# Patient Record
Sex: Female | Born: 1935 | Race: White | Hispanic: No | Marital: Married | Smoking: Former smoker
Health system: Southern US, Community
[De-identification: ages and names within clinical notes are randomized; demographics above are authoritative.]

## PROBLEM LIST (undated history)

## (undated) DIAGNOSIS — G4730 Sleep apnea, unspecified: Secondary | ICD-10-CM

## (undated) DIAGNOSIS — I10 Essential (primary) hypertension: Secondary | ICD-10-CM

## (undated) DIAGNOSIS — E785 Hyperlipidemia, unspecified: Secondary | ICD-10-CM

## (undated) HISTORY — DX: Essential (primary) hypertension: I10

## (undated) HISTORY — DX: Hyperlipidemia, unspecified: E78.5

## (undated) HISTORY — PX: VESICOVAGINAL FISTULA CLOSURE W/ TAH: SUR271

## (undated) HISTORY — DX: Sleep apnea, unspecified: G47.30

---

## 1982-06-19 HISTORY — PX: GALLBLADDER SURGERY: SHX652

## 1998-10-25 ENCOUNTER — Other Ambulatory Visit: Admission: RE | Admit: 1998-10-25 | Discharge: 1998-10-25 | Payer: Self-pay | Admitting: Obstetrics and Gynecology

## 2000-05-24 ENCOUNTER — Other Ambulatory Visit: Admission: RE | Admit: 2000-05-24 | Discharge: 2000-05-24 | Payer: Self-pay | Admitting: *Deleted

## 2000-08-25 ENCOUNTER — Emergency Department (HOSPITAL_COMMUNITY): Admission: EM | Admit: 2000-08-25 | Discharge: 2000-08-25 | Payer: Self-pay | Admitting: Emergency Medicine

## 2000-11-22 ENCOUNTER — Encounter: Payer: Self-pay | Admitting: Family Medicine

## 2000-11-22 ENCOUNTER — Encounter: Admission: RE | Admit: 2000-11-22 | Discharge: 2000-11-22 | Payer: Self-pay | Admitting: Family Medicine

## 2001-04-29 ENCOUNTER — Encounter: Admission: RE | Admit: 2001-04-29 | Discharge: 2001-04-29 | Payer: Self-pay | Admitting: Family Medicine

## 2001-04-29 ENCOUNTER — Encounter: Payer: Self-pay | Admitting: Family Medicine

## 2001-08-09 ENCOUNTER — Other Ambulatory Visit: Admission: RE | Admit: 2001-08-09 | Discharge: 2001-08-09 | Payer: Self-pay | Admitting: Obstetrics and Gynecology

## 2005-06-19 HISTORY — PX: OTHER SURGICAL HISTORY: SHX169

## 2008-04-06 ENCOUNTER — Encounter: Payer: Self-pay | Admitting: *Deleted

## 2008-04-06 DIAGNOSIS — E785 Hyperlipidemia, unspecified: Secondary | ICD-10-CM | POA: Insufficient documentation

## 2008-04-06 DIAGNOSIS — F329 Major depressive disorder, single episode, unspecified: Secondary | ICD-10-CM | POA: Insufficient documentation

## 2008-04-06 DIAGNOSIS — K219 Gastro-esophageal reflux disease without esophagitis: Secondary | ICD-10-CM | POA: Insufficient documentation

## 2008-04-06 DIAGNOSIS — I10 Essential (primary) hypertension: Secondary | ICD-10-CM | POA: Insufficient documentation

## 2008-04-06 DIAGNOSIS — R32 Unspecified urinary incontinence: Secondary | ICD-10-CM | POA: Insufficient documentation

## 2008-04-07 ENCOUNTER — Ambulatory Visit: Payer: Self-pay | Admitting: *Deleted

## 2008-04-07 DIAGNOSIS — K449 Diaphragmatic hernia without obstruction or gangrene: Secondary | ICD-10-CM | POA: Insufficient documentation

## 2008-04-07 DIAGNOSIS — M159 Polyosteoarthritis, unspecified: Secondary | ICD-10-CM | POA: Insufficient documentation

## 2008-04-07 DIAGNOSIS — G4733 Obstructive sleep apnea (adult) (pediatric): Secondary | ICD-10-CM | POA: Insufficient documentation

## 2008-04-10 ENCOUNTER — Encounter (INDEPENDENT_AMBULATORY_CARE_PROVIDER_SITE_OTHER): Payer: Self-pay | Admitting: *Deleted

## 2008-04-16 DIAGNOSIS — I2510 Atherosclerotic heart disease of native coronary artery without angina pectoris: Secondary | ICD-10-CM | POA: Insufficient documentation

## 2008-04-16 DIAGNOSIS — R002 Palpitations: Secondary | ICD-10-CM | POA: Insufficient documentation

## 2008-04-16 DIAGNOSIS — D649 Anemia, unspecified: Secondary | ICD-10-CM | POA: Insufficient documentation

## 2008-04-16 DIAGNOSIS — E739 Lactose intolerance, unspecified: Secondary | ICD-10-CM | POA: Insufficient documentation

## 2008-04-16 DIAGNOSIS — Z8669 Personal history of other diseases of the nervous system and sense organs: Secondary | ICD-10-CM | POA: Insufficient documentation

## 2008-04-16 DIAGNOSIS — R42 Dizziness and giddiness: Secondary | ICD-10-CM | POA: Insufficient documentation

## 2008-04-16 DIAGNOSIS — J45909 Unspecified asthma, uncomplicated: Secondary | ICD-10-CM | POA: Insufficient documentation

## 2008-04-16 DIAGNOSIS — R209 Unspecified disturbances of skin sensation: Secondary | ICD-10-CM | POA: Insufficient documentation

## 2008-04-16 DIAGNOSIS — F411 Generalized anxiety disorder: Secondary | ICD-10-CM | POA: Insufficient documentation

## 2011-06-22 ENCOUNTER — Telehealth: Payer: Self-pay | Admitting: Internal Medicine

## 2011-06-22 NOTE — Telephone Encounter (Signed)
Error.  Ok to schedule.  Christine Mcconnell

## 2011-07-14 ENCOUNTER — Ambulatory Visit (INDEPENDENT_AMBULATORY_CARE_PROVIDER_SITE_OTHER): Payer: Medicare Other | Admitting: Internal Medicine

## 2011-07-14 ENCOUNTER — Institutional Professional Consult (permissible substitution): Payer: Self-pay | Admitting: Internal Medicine

## 2011-07-14 ENCOUNTER — Encounter: Payer: Self-pay | Admitting: Internal Medicine

## 2011-07-14 DIAGNOSIS — J449 Chronic obstructive pulmonary disease, unspecified: Secondary | ICD-10-CM

## 2011-07-14 DIAGNOSIS — J45909 Unspecified asthma, uncomplicated: Secondary | ICD-10-CM

## 2011-07-14 NOTE — Progress Notes (Signed)
07/14/11- 30 yoF former smoker referred courtesy of Primus Bravo, NP, [facility] because of wheezing dyspnea.  She quit smoking 25 years ago. She describes wheezing over the past several months, not prior. There is no history of asthma but she has had previous bronchitis and pneumonia. It helps to use her albuterol inhaler. She was put on albuterol 2 mg tablets used 3 times daily and well tolerated. He wheezes at night and randomly through the day with a little variation from one day to the next. She doesn't recognize the effect of location, whether or exposure. She does notice shortness of breath with exertion. She had had a recent chest x-ray not yet available to [place]. Admits heartburn, controlled by Prilosec. She refluxes solid foods and coughs drinking liquids. Medical history of coronary disease with 2 stents, hypertension but no infarction or heart failure. Surgery for bilateral knee replacement without history of DVT. Treated for sleep apnea. Diagnosed with a mild CVA in 1985. Has had flu and pneumococcal vaccines. Married to [place]- a patient here, retired from housekeeping at [employer]. 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  ROS-see HPI Constitutional:   No-   weight loss, night sweats, fevers, chills, fatigue, lassitude. HEENT:   No-  headaches, difficulty swallowing, tooth/dental problems, sore throat,       No-  sneezing, itching, ear ache, nasal congestion, post nasal drip,  CV:  No-   chest pain, orthopnea, PND, swelling in lower extremities, anasarca, dizziness, palpitations Resp: No-   shortness of breath with exertion or at rest.              No-   productive cough,  No non-productive cough,  No- coughing up of blood.              No-   change in color of mucus.  No- wheezing.   Skin: No-   rash or lesions. GI:  + heartburn, indigestion,  No-abdominal pain, nausea, vomiting, diarrhea,                 change in bowel habits, loss of appetite GU: No-   dysuria, change in color of urine, no  urgency or frequency.  No- flank pain. MS:  + joint pain or swelling.  No- decreased range of motion.  No- back pain. Neuro-     nothing unusual Psych:  No- change in mood or affect. No depression, + anxiety.  No memory loss.   OBJ General- Alert, Oriented, Affect-appropriate, Distress- none acute, obese Skin- rash-none, lesions- none, excoriation- none Lymphadenopathy- none Head- atraumatic            Eyes- Gross vision intact, PERRLA, conjunctivae clear secretions            Ears- Hearing, canals-normal            Nose- Ccrusting mucus, no-Septal dev, mucus, polyps, erosion, perforation             Throat- Mallampati II , mucosa clear , drainage- none, tonsils- atrophic, dentures Neck- flexible , trachea midline, no stridor , thyroid nl, carotid no bruit Chest - symmetrical excursion , unlabored           Heart/CV- RRR , no murmur , no gallop  , no rub, nl s1 s2                           - JVD- none , edema- none, stasis changes- none, varices- none  Lung- clear to P&A, shallow, wheeze- none, cough- none , dullness-none, rub- none           Chest wall-  Abd- tender-no, distended-no, bowel sounds-present, HSM- no Br/ Gen/ Rectal- Not done, not indicated Extrem- cyanosis- none, clubbing, none, atrophy- none, strength- nl Neuro- resting tremor R hand

## 2011-07-14 NOTE — Patient Instructions (Signed)
We will contact your primary physician for report of your last CXR and the cardiac stress test for our records.  Sample Tudorza- 1 puff twice daily. You can use this in addition to, or instead of, your albuterol tablets.  Order- schedule PFT  Dx  asthma

## 2011-07-16 NOTE — Assessment & Plan Note (Addendum)
We need to get baseline pulmonary function tests to understand how much reversible lung disease she has, and how much of what she describes is related to obstructive airways disease as opposed to obesity hypoventilation, deconditioning and may be her heart disease. She has had a stress test at some time in the past. Albuterol tablets are not a common early medication choices and a longer but she is tolerating them. Plan-schedule PFT, sample trial Tudorza.

## 2011-08-11 ENCOUNTER — Ambulatory Visit (INDEPENDENT_AMBULATORY_CARE_PROVIDER_SITE_OTHER): Payer: Medicare Other | Admitting: Internal Medicine

## 2011-08-11 ENCOUNTER — Encounter: Payer: Self-pay | Admitting: Internal Medicine

## 2011-08-11 VITALS — BP 142/90 | HR 75 | Ht 62.0 in | Wt 234.0 lb

## 2011-08-11 DIAGNOSIS — J45909 Unspecified asthma, uncomplicated: Secondary | ICD-10-CM

## 2011-08-11 LAB — PULMONARY FUNCTION TEST

## 2011-08-11 NOTE — Patient Instructions (Signed)
Stop the Tudorza inhaler, since we can't tell that it its helping you.   Sample Dulera 100     2 puffs, then rinse mouth, twice every day   Walk for stamina and weight loss, as much as you can.

## 2011-08-11 NOTE — Progress Notes (Signed)
07/14/11- 87 yoF former smoker referred courtesy of Primus Bravo, NP, [facility] because of wheezing dyspnea.  She quit smoking 25 years ago. She describes wheezing over the past several months, not prior. There is no history of asthma but she has had previous bronchitis and pneumonia. It helps to use her albuterol inhaler. She was put on albuterol 2 mg tablets used 3 times daily and well tolerated. He wheezes at night and randomly through the day with a little variation from one day to the next. She doesn't recognize the effect of location, whether or exposure. She does notice shortness of breath with exertion. She had had a recent chest x-ray not yet available to [place]. Admits heartburn, controlled by Prilosec. She refluxes solid foods and coughs drinking liquids. Medical history of coronary disease with 2 stents, hypertension but no infarction or heart failure. Surgery for bilateral knee replacement without history of DVT. Treated for sleep apnea. Diagnosed with a mild CVA in 1985. Has had flu and pneumococcal vaccines. Married to [place]- a patient here, retired from housekeeping at [employer]. 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  08/11/11- 26 yoF former smoker referred courtesy of Primus Bravo, NP, [facility] because of wheezing dyspnea.   Husband here. She still likes her albuterol tablets which she says causes no nervousness or palpitation. Saw a little added benefit from [place] trial. PFT- 08/11/2011 slight obstructive airways disease in small airways with response to bronchodilator. FEV1 1.41/86%, FEV1/FVC 0.81 TLC 88% DLCO 74%.  ROS-see HPI Constitutional:   No-   weight loss, night sweats, fevers, chills, fatigue, lassitude. HEENT:   No-  headaches, difficulty swallowing, tooth/dental problems, sore throat,       No-  sneezing, itching, ear ache, nasal congestion, post nasal drip,  CV:  No-   chest pain, orthopnea, PND, swelling in lower extremities, anasarca, dizziness, palpitations Resp:  + shortness of breath with exertion or at rest.              No-   productive cough,  No non-productive cough,  No- coughing up of blood.              No-   change in color of mucus.  + wheezing.   Skin: No-   rash or lesions. GI:  + heartburn, indigestion,  No-abdominal pain, nausea, vomiting, diarrhea,                 change in bowel habits, loss of appetite GU:  MS:  + joint pain or swelling.  No- decreased range of motion.  No- back pain. Neuro-     nothing unusual Psych:  No- change in mood or affect. No depression, + anxiety.  No memory loss.   OBJ General- Alert, Oriented, Affect-appropriate, Distress- none acute, obese Skin- rash-none, lesions- none, excoriation- none Lymphadenopathy- none Head- atraumatic            Eyes- Gross vision intact, PERRLA, conjunctivae clear secretions            Ears- Hearing, canals-normal            Nose- Ccrusting mucus, no-Septal dev, mucus, polyps, erosion, perforation             Throat- Mallampati II , mucosa clear , drainage- none, tonsils- atrophic, dentures Neck- flexible , trachea midline, no stridor , thyroid nl, carotid no bruit Chest - symmetrical excursion , unlabored           Heart/CV- RRR , no murmur , no gallop  ,  no rub, nl s1 s2                           - JVD- none , edema- none, stasis changes- none, varices- none           Lung- trace wheeze, shallow, , cough- none , dullness-none, rub- none           Chest wall-  Abd-  Br/ Gen/ Rectal- Not done, not indicated Extrem- cyanosis- none, clubbing, none, atrophy- none, strength- nl Neuro- resting tremor R hand

## 2011-08-11 NOTE — Progress Notes (Signed)
PFT done today. 

## 2011-08-14 ENCOUNTER — Encounter: Payer: Self-pay | Admitting: Internal Medicine

## 2011-08-14 NOTE — Assessment & Plan Note (Signed)
Mild reversible small airways obstructive disease consistent with asthma rather than fixed COPD. I explained why albuterol tablets are not preferred, because of the increased incidence of systemic side effects. For comparison she will try a maintenance steroid/bronchodilator, saving the albuterol tablets for rescue use. Weight loss and walking to improve stamina are recommended.

## 2011-09-25 ENCOUNTER — Telehealth: Payer: Self-pay | Admitting: Internal Medicine

## 2011-09-25 NOTE — Telephone Encounter (Signed)
Left message that we have the papers from [facility] about new CPAP machine; I will have CY sign papers in am and will fax back asap. If patient should have any other questions or concerns then she should call our office.

## 2011-11-14 ENCOUNTER — Other Ambulatory Visit: Payer: Self-pay | Admitting: Sports Medicine

## 2011-11-14 ENCOUNTER — Ambulatory Visit: Payer: Medicare Other | Admitting: Internal Medicine

## 2011-11-14 DIAGNOSIS — M47817 Spondylosis without myelopathy or radiculopathy, lumbosacral region: Secondary | ICD-10-CM

## 2011-11-21 ENCOUNTER — Other Ambulatory Visit: Payer: Self-pay | Admitting: Sports Medicine

## 2011-11-21 ENCOUNTER — Ambulatory Visit
Admission: RE | Admit: 2011-11-21 | Discharge: 2011-11-21 | Disposition: A | Discharge status: Home / Self Care | Payer: Medicare Other | Source: Ambulatory Visit | Attending: Sports Medicine | Admitting: Sports Medicine

## 2011-11-21 DIAGNOSIS — M47817 Spondylosis without myelopathy or radiculopathy, lumbosacral region: Secondary | ICD-10-CM

## 2012-03-15 ENCOUNTER — Encounter: Payer: Self-pay | Admitting: Internal Medicine

## 2012-03-15 ENCOUNTER — Ambulatory Visit (INDEPENDENT_AMBULATORY_CARE_PROVIDER_SITE_OTHER)
Admission: RE | Admit: 2012-03-15 | Discharge: 2012-03-15 | Disposition: A | Discharge status: Home / Self Care | Payer: Medicare Other | Source: Ambulatory Visit | Attending: Internal Medicine | Admitting: Internal Medicine

## 2012-03-15 ENCOUNTER — Ambulatory Visit (INDEPENDENT_AMBULATORY_CARE_PROVIDER_SITE_OTHER): Payer: Medicare Other | Admitting: Internal Medicine

## 2012-03-15 VITALS — BP 136/86 | HR 63 | Ht 62.0 in | Wt 237.8 lb

## 2012-03-15 DIAGNOSIS — J45909 Unspecified asthma, uncomplicated: Secondary | ICD-10-CM

## 2012-03-15 DIAGNOSIS — Z23 Encounter for immunization: Secondary | ICD-10-CM

## 2012-03-15 MED ORDER — ALBUTEROL SULFATE HFA 108 (90 BASE) MCG/ACT IN AERS: 2.0000 | INHALATION_SPRAY | Freq: Four times a day (QID) | RESPIRATORY_TRACT | Status: DC | PRN

## 2012-03-15 NOTE — Patient Instructions (Addendum)
Order- CXR  Dx asthma  Flu vax  Script for Proair rescue albuterol inhaler refill  sent

## 2012-03-15 NOTE — Progress Notes (Signed)
07/14/11- 65 yoF former smoker referred courtesy of Primus Bravo, NP, [facility] because of wheezing dyspnea.  She quit smoking 25 years ago. She describes wheezing over the past several months, not prior. There is no history of asthma but she has had previous bronchitis and pneumonia. It helps to use her albuterol inhaler. She was put on albuterol 2 mg tablets used 3 times daily and well tolerated. He wheezes at night and randomly through the day with a little variation from one day to the next. She doesn't recognize the effect of location, whether or exposure. She does notice shortness of breath with exertion. She had had a recent chest x-ray not yet available to [place]. Admits heartburn, controlled by Prilosec. She refluxes solid foods and coughs drinking liquids. Medical history of coronary disease with 2 stents, hypertension but no infarction or heart failure. Surgery for bilateral knee replacement without history of DVT. Treated for sleep apnea. Diagnosed with a mild CVA in 1985. Has had flu and pneumococcal vaccines. 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  08/11/11- 10 yoF former smoker referred courtesy of Primus Bravo, NP, [facility] because of wheezing dyspnea.   Husband here. She still likes her albuterol tablets which she says causes no nervousness or palpitation. Saw a little added benefit from [place] trial. PFT- 08/11/2011 slight obstructive airways disease in small airways with response to bronchodilator. FEV1 1.41/86%, FEV1/FVC 0.81 TLC 88% DLCO 74%.  03/15/12- 26 yoF former smoker referred courtesy of Primus Bravo, NP, [facility] because of wheezing dyspnea/ Allergic Asthma, complicated by CAD/ stents, OSA/ CPAP. Husband here  Pt states breathing is "fair". Elwin Sleight was too expensive. Spiriva helps some. Still some wheeze. Proair rescue inhaler 2 or 3 times daily plus albuterol tablets 2 mg, 3 times  daily. Uses CPAP/ [facility], unknown pressure, based on sleep study done in [place].  ROS-see HPI Constitutional:   No-   weight loss, night sweats, fevers, chills, fatigue, lassitude. HEENT:   No-  headaches, difficulty swallowing, tooth/dental problems, sore throat,       No-  sneezing, itching, ear ache, nasal congestion, post nasal drip,  CV:  No-   chest pain, orthopnea, PND, swelling in lower extremities, anasarca, dizziness, palpitations Resp: + shortness of breath with exertion or at rest.              No-   productive cough,  No non-productive cough,  No- coughing up of blood.              No-   change in color of mucus.  + wheezing.   Skin: No-   rash or lesions. GI:  + heartburn, indigestion,  No-abdominal pain, nausea, vomiting,            GU:  MS:  + joint pain or swelling.   Neuro-     nothing unusual Psych:  No- change in mood or affect. No depression, + anxiety.  No memory loss.   OBJ General- Alert, Oriented, Affect-appropriate, Distress- none acute, obese Skin- rash-none, lesions- none, excoriation- none Lymphadenopathy- none Head- atraumatic            Eyes- Gross vision intact, PERRLA, conjunctivae clear secretions            Ears- Hearing, canals-normal            Nose- +Crusting mucus, no-Septal dev, mucus, polyps, erosion, perforation  Throat- Mallampati III , mucosa clear , drainage- none, tonsils- atrophic, dentures Neck- flexible , trachea midline, no stridor , thyroid nl, carotid no bruit Chest - symmetrical excursion , unlabored           Heart/CV- RRR , no murmur , no gallop  , no rub, nl s1 s2                           - JVD- none , edema- none, stasis changes- none, varices- none           Lung-  Clear,  cough- none , dullness-none, rub- none           Chest wall-  Abd-  Br/ Gen/ Rectal- Not done, not indicated Extrem- cyanosis- none, clubbing, none, atrophy- none, strength- nl Neuro- resting tremor R hand

## 2012-03-22 NOTE — Progress Notes (Signed)
Quick Note:  Pt aware of results. ______ 

## 2012-03-24 NOTE — Assessment & Plan Note (Signed)
Finances are limited. We considered using a maintenance steroid but decided to see how she does as the season progresses.  Plan- refill rescue inhaler with education on use, flu vaccine,CXR

## 2012-05-02 ENCOUNTER — Telehealth: Payer: Self-pay | Admitting: Internal Medicine

## 2012-05-02 DIAGNOSIS — J45909 Unspecified asthma, uncomplicated: Secondary | ICD-10-CM

## 2012-05-02 NOTE — Telephone Encounter (Signed)
I spoke with pt and she is requesting samples of albuterol sulfate tablets, spiriva, and albuterol inhaler. I advised pt we did not have albuterol sulfate tablets. She is wanting to an alternative to this as this is $250 and she is in the "gap". Please advise thanks

## 2012-05-03 NOTE — Telephone Encounter (Signed)
Cheapest for her would be to get set up next week with a nebulizer machine and albuterol 0.083, # 100, 1 neb every 6 hours as needed. Refill prn  For dx asthma

## 2012-05-06 MED ORDER — ALBUTEROL SULFATE (2.5 MG/3ML) 0.083% IN NEBU: 2.5000 mg | INHALATION_SOLUTION | Freq: Four times a day (QID) | RESPIRATORY_TRACT | Status: AC | PRN

## 2012-05-06 MED ORDER — ALBUTEROL SULFATE (2.5 MG/3ML) 0.083% IN NEBU: 2.5000 mg | INHALATION_SOLUTION | Freq: Four times a day (QID) | RESPIRATORY_TRACT | Status: DC | PRN

## 2012-05-06 NOTE — Telephone Encounter (Signed)
lmtcb x1 

## 2012-05-06 NOTE — Telephone Encounter (Signed)
Spoke with pt and notified of recs per CDY She verbalized understanding and is fine with trying nebs Order was sent to DME and rx faxed to [facility]

## 2012-06-24 ENCOUNTER — Telehealth: Payer: Self-pay | Admitting: Internal Medicine

## 2012-06-24 NOTE — Telephone Encounter (Signed)
I spoke with the pt daughter and she is requesting a follow-up appt this week for the pt. Appt made for tomorrow at 10am. Carron Curie, CMA

## 2012-06-25 ENCOUNTER — Ambulatory Visit (INDEPENDENT_AMBULATORY_CARE_PROVIDER_SITE_OTHER): Payer: Medicare Other | Admitting: Internal Medicine

## 2012-06-25 ENCOUNTER — Encounter: Payer: Self-pay | Admitting: Internal Medicine

## 2012-06-25 VITALS — BP 142/80 | HR 91 | Ht 62.0 in | Wt 241.4 lb

## 2012-06-25 DIAGNOSIS — I890 Lymphedema, not elsewhere classified: Secondary | ICD-10-CM

## 2012-06-25 DIAGNOSIS — G4730 Sleep apnea, unspecified: Secondary | ICD-10-CM

## 2012-06-25 DIAGNOSIS — R918 Other nonspecific abnormal finding of lung field: Secondary | ICD-10-CM

## 2012-06-25 DIAGNOSIS — R911 Solitary pulmonary nodule: Secondary | ICD-10-CM

## 2012-06-25 NOTE — Progress Notes (Signed)
07/14/11- 33 yoF former smoker referred courtesy of Primus Bravo, NP, [facility] because of wheezing dyspnea.  She quit smoking 25 years ago. She describes wheezing over the past several months, not prior. There is no history of asthma but she has had previous bronchitis and pneumonia. It helps to use her albuterol inhaler. She was put on albuterol 2 mg tablets used 3 times daily and well tolerated. He wheezes at night and randomly through the day with a little variation from one day to the next. She doesn't recognize the effect of location, whether or exposure. She does notice shortness of breath with exertion. She had had a recent chest x-ray not yet available to [place]. Admits heartburn, controlled by Prilosec. She refluxes solid foods and coughs drinking liquids. Medical history of coronary disease with 2 stents, hypertension but no infarction or heart failure. Surgery for bilateral knee replacement without history of DVT. Treated for sleep apnea. Diagnosed with a mild CVA in 1985. Has had flu and pneumococcal vaccines. 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  08/11/11- 83 yoF former smoker referred courtesy of Primus Bravo, NP, [facility] because of wheezing dyspnea.   Husband here. She still likes her albuterol tablets which she says causes no nervousness or palpitation. Saw a little added benefit from [place] trial. PFT- 08/11/2011 slight obstructive airways disease in small airways with response to bronchodilator. FEV1 1.41/86%, FEV1/FVC 0.81 TLC 88% DLCO 74%.  03/15/12- 36 yoF former smoker referred courtesy of Primus Bravo, NP, [facility] because of wheezing dyspnea/ Allergic Asthma, complicated by CAD/ stents, OSA/ CPAP. Husband here  Pt states breathing is "fair". Elwin Sleight was too expensive. Spiriva helps some. Still some wheeze. Proair rescue inhaler 2 or 3 times daily plus albuterol tablets 2 mg, 3 times  daily. Uses CPAP/ [facility], unknown pressure, based on sleep study done in [place].  06/25/12- 67 yoF former smoker referred courtesy of Primus Bravo, NP, [facility] because of wheezing dyspnea/ Allergic Asthma, complicated by CAD/ stents, OSA/ CPAP. Husband here Since last here, hospitalized at [facility] December 23 through 06/14/2012 for acute on chronic respiratory failure possible early pneumonia, 1.5 cm nodule. Discharge summary reviewed. She says she went because her legs were red and swollen. She dropped off of CPAP. Breathing is now better, can walk short distances in the home but is unsteady and dyspneic. She is unclear about her medications but has home nurse and physical therapist coming. O2 2L/ [facility] CXR 03/22/12- IMPRESSION:  No active disease. Markedly tortuous ectatic thoracic aorta.  Upper thoracic levoscoliosis and lower thoracic dextroscoliosis.  Original Report Authenticated By: Natasha Mead, M.D.  [facility]hest 06/10/2012- Report available: Clustered nodules left lower lobe, likely infectious, isolated 1.5 cm nodule left lower lobe with followup CT recommended in 2-3 months, limited technically for evaluation of pulmonary embolism with no central emboli seen, coronary disease.  ROS-see HPI Constitutional:   No-   weight loss, night sweats, fevers, chills, fatigue, lassitude. HEENT:   No-  headaches, difficulty swallowing, tooth/dental problems, sore throat,       No-  sneezing, itching, ear ache, nasal congestion, post nasal drip,  CV:  No-   chest pain, orthopnea, PND, +swelling in lower extremities, no-anasarca, dizziness, palpitations Resp: + shortness of breath with exertion or at rest.              No-   productive cough,  No non-productive cough,  No- coughing up of blood.  No-   change in color of mucus.  + wheezing.   Skin: No-   rash or lesions. GI:  + heartburn, indigestion,  No-abdominal pain, nausea, vomiting,             GU:  MS:  + joint pain or swelling.   Neuro-     nothing unusual Psych:  No- change in mood or affect. No depression, + anxiety.  No memory loss.   OBJ BP 142/80  Pulse 91  Ht 5\' 2"  (1.575 m)  Wt 241 lb 6.4 oz (109.498 kg)  BMI 44.15 kg/m2  SpO2 96% O2 2L General- Alert, Oriented, Affect-appropriate, Distress- none acute, obese Skin- rash-none, lesions- none, excoriation- none Lymphadenopathy- none Head- atraumatic            Eyes- Gross vision intact, PERRLA, conjunctivae clear secretions            Ears- Hearing, canals-normal            Nose- +Crusting mucus, no-Septal dev, mucus, polyps, erosion, perforation             Throat- Mallampati III , mucosa clear , drainage- none, tonsils- atrophic, dentures Neck- flexible , trachea midline, no stridor , thyroid nl, carotid no bruit Chest - symmetrical excursion , unlabored           Heart/CV- RRR , no murmur , no gallop  , no rub, nl s1 s2                           - JVD- none , edema+2-3, stasis changes+, varices- none           Lung-  + few crackles,  cough- none , dullness-none, rub- none           Chest wall-  Abd-  Br/ Gen/ Rectal- Not done, not indicated Extrem- cyanosis- none, clubbing, none, atrophy- none, strength-poor, rolling walker Neuro- resting tremor R hand

## 2012-06-25 NOTE — Patient Instructions (Addendum)
Order- future CT chest at [facility] dx lung nodule    Compare with prior            Need this done before her return here at end of March  Order- CME- [facility]  She now has CPAP from [facility] Home O2 from [facility]. They need to come out TODAY and show her how to connect her O2 to her CPAP for night use.

## 2012-07-06 DIAGNOSIS — R918 Other nonspecific abnormal finding of lung field: Secondary | ICD-10-CM | POA: Insufficient documentation

## 2012-07-06 DIAGNOSIS — R911 Solitary pulmonary nodule: Secondary | ICD-10-CM | POA: Insufficient documentation

## 2012-07-06 DIAGNOSIS — I890 Lymphedema, not elsewhere classified: Secondary | ICD-10-CM | POA: Insufficient documentation

## 2012-07-06 NOTE — Assessment & Plan Note (Signed)
Plan-we are requesting the CT disc for our review. Meanwhile three-month followup CT, to be done in March, he is being scheduled for comparison at [facility].

## 2012-07-06 NOTE — Assessment & Plan Note (Signed)
Noncompliant, claiming to her DME company did not show her how to use oxygen with CPAP so she dropped the CPAP. Plan-we're contacting [facility] to show her today how to bleed oxygen at 2 L into her CPAP for sleep at night. We also need to learn for our records what her CPAP pressure setting is.

## 2012-07-06 NOTE — Assessment & Plan Note (Signed)
This may be atypical infection such as MAIC. We will compare when she has a CT scan in March, 2014

## 2012-07-06 NOTE — Assessment & Plan Note (Signed)
Probably accommodation of fluid overload, obesity and venous insufficiency. Exam is nonspecific. Venous thromboembolic disease is not identified during hospitalization. This is managed by her primary physician.

## 2012-07-29 ENCOUNTER — Telehealth: Payer: Self-pay | Admitting: Internal Medicine

## 2012-07-29 NOTE — Telephone Encounter (Signed)
Spoke with patient made her aware of recs as listed below per CY. Verbalized understanding and nothing further needed at this time.

## 2012-07-29 NOTE — Telephone Encounter (Signed)
Use O2 for sleep and with exertion at 2L/M

## 2012-07-29 NOTE — Telephone Encounter (Signed)
Spoke with pt She states that she is needing to know when to use o2 She states that she thought that she was just supposed to use at night with CPAP, and this is what her chart says, but [facility] told her to use it "pretty much all day". Please advise, thanks! Last ov 06/26/11  Next ov 09/12/12

## 2012-09-12 ENCOUNTER — Ambulatory Visit: Payer: Medicare Other | Admitting: Internal Medicine

## 2014-04-23 ENCOUNTER — Other Ambulatory Visit: Payer: Self-pay | Admitting: Orthopedic Surgery

## 2014-04-23 DIAGNOSIS — M545 Low back pain, unspecified: Secondary | ICD-10-CM

## 2014-05-06 ENCOUNTER — Ambulatory Visit
Admission: RE | Admit: 2014-05-06 | Discharge: 2014-05-06 | Disposition: A | Discharge status: Home / Self Care | Payer: Medicare Other | Source: Ambulatory Visit | Attending: Orthopedic Surgery | Admitting: Orthopedic Surgery

## 2014-05-06 DIAGNOSIS — M545 Low back pain, unspecified: Secondary | ICD-10-CM

## 2014-06-25 ENCOUNTER — Encounter: Payer: Self-pay | Admitting: Internal Medicine

## 2014-06-25 ENCOUNTER — Encounter (INDEPENDENT_AMBULATORY_CARE_PROVIDER_SITE_OTHER): Payer: Self-pay

## 2014-06-25 ENCOUNTER — Ambulatory Visit (INDEPENDENT_AMBULATORY_CARE_PROVIDER_SITE_OTHER): Payer: Medicare HMO | Admitting: Internal Medicine

## 2014-06-25 ENCOUNTER — Ambulatory Visit (INDEPENDENT_AMBULATORY_CARE_PROVIDER_SITE_OTHER)
Admission: RE | Admit: 2014-06-25 | Discharge: 2014-06-25 | Disposition: A | Discharge status: Home / Self Care | Payer: Medicare HMO | Source: Ambulatory Visit | Attending: Internal Medicine | Admitting: Internal Medicine

## 2014-06-25 VITALS — BP 134/78 | HR 82 | Ht 62.0 in | Wt 224.0 lb

## 2014-06-25 DIAGNOSIS — G4733 Obstructive sleep apnea (adult) (pediatric): Secondary | ICD-10-CM

## 2014-06-25 DIAGNOSIS — J449 Chronic obstructive pulmonary disease, unspecified: Secondary | ICD-10-CM

## 2014-06-25 DIAGNOSIS — R918 Other nonspecific abnormal finding of lung field: Secondary | ICD-10-CM

## 2014-06-25 DIAGNOSIS — J4530 Mild persistent asthma, uncomplicated: Secondary | ICD-10-CM

## 2014-06-25 MED ORDER — FLUTICASONE FUROATE-VILANTEROL 100-25 MCG/INH IN AEPB: INHALATION_SPRAY | RESPIRATORY_TRACT | Status: DC

## 2014-06-25 NOTE — Progress Notes (Signed)
07/14/11- 89 yoF former smoker referred courtesy of Primus Bravo, NP, [facility] because of wheezing dyspnea.  She quit smoking 25 years ago. She describes wheezing over the past several months, not prior. There is no history of asthma but she has had previous bronchitis and pneumonia. It helps to use her albuterol inhaler. She was put on albuterol 2 mg tablets used 3 times daily and well tolerated. He wheezes at night and randomly through the day with a little variation from one day to the next. She doesn't recognize the effect of location, whether or exposure. She does notice shortness of breath with exertion. She had had a recent chest x-ray not yet available to [place]. Admits heartburn, controlled by Prilosec. She refluxes solid foods and coughs drinking liquids. Medical history of coronary disease with 2 stents, hypertension but no infarction or heart failure. Surgery for bilateral knee replacement without history of DVT. Treated for sleep apnea. Diagnosed with a mild CVA in 1985. Has had flu and pneumococcal vaccines. Married to [place]- a patient here, retired from housekeeping at [employer]. 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  08/11/11- 63 yoF former smoker referred courtesy of Primus Bravo, NP, [facility] because of wheezing dyspnea.   Husband here. She still likes her albuterol tablets which she says causes no nervousness or palpitation. Saw a little added benefit from [place] trial. PFT- 08/11/2011 slight obstructive airways disease in small airways with response to bronchodilator. FEV1 1.41/86%, FEV1/FVC 0.81 TLC 88% DLCO 74%.  03/15/12- 63 yoF former smoker referred courtesy of Primus Bravo, NP, [facility] because of wheezing dyspnea/ Allergic Asthma, complicated by CAD/ stents, OSA/ CPAP. Husband here  Pt states breathing is "fair". Elwin Sleight was too expensive. Spiriva helps some. Still some wheeze. Proair rescue inhaler 2 or 3 times daily plus albuterol tablets 2 mg, 3 times  daily. Uses CPAP/ [facility], unknown pressure, based on sleep study done in [place].  06/25/12- 35 yoF former smoker referred courtesy of Primus Bravo, NP, [facility] because of wheezing dyspnea/ Allergic Asthma, complicated by CAD/ stents, OSA/ CPAP. Husband here Since last here, hospitalized at [facility]/ December 23 through 06/14/2012 for acute on chronic respiratory failure possible early pneumonia, 1.5 cm nodule. Discharge summary reviewed. She says she went because her legs were red and swollen. She dropped off of CPAP. Breathing is now better, can walk short distances in the home but is unsteady and dyspneic. She is unclear about her medications but has home nurse and physical therapist coming. O2 2L/ [facility] CXR 03/22/12- IMPRESSION:  No active disease. Markedly tortuous ectatic thoracic aorta.  Upper thoracic levoscoliosis and lower thoracic dextroscoliosis.  Original Report Authenticated By: Natasha Mead, M.D.  CTaChest 06/10/2012- Report available: Clustered nodules left lower lobe, likely infectious, isolated 1.5 cm nodule left lower lobe with followup CT recommended in 2-3 months, limited technically for evaluation of pulmonary embolism with no central emboli seen, coronary disease.  06/25/14- 34 yoF former smoker referred courtesy of Primus Bravo, NP, [facility] because of wheezing dyspnea/ Allergic Asthma, complicated by CAD/ stents, OSA/ CPAP. Husband here Did not keep appt for CT scheduled 06/25/12 FOLLOWS FOR: Pt reports breathing has been fair since last OV-- breathing feels restricted at night. Pt has new insurance and needs updated tests--brought paper from Insurance to review. O2 2-3 L for sleep and as needed/ [facility] She is not using CPAP. Occasionally uses her nebulizer. Denies productive cough  ROS-see HPI Constitutional:   No-   weight loss, night sweats, fevers, chills,  fatigue, lassitude. HEENT:   No-  headaches, difficulty  swallowing, tooth/dental problems, sore throat,       No-  sneezing, itching, ear ache, nasal congestion, post nasal drip,  CV:  No-   chest pain, orthopnea, PND, +swelling in lower extremities, no-anasarca, dizziness, palpitations Resp: + shortness of breath with exertion or at rest.              No-   productive cough,  + non-productive cough,  No- coughing up of blood.              No-   change in color of mucus.  + wheezing.   Skin: No-   rash or lesions. GI:  + heartburn, indigestion,  No-abdominal pain, nausea, vomiting,            GU:  MS:  + joint pain or swelling.   Neuro-     nothing unusual Psych:  No- change in mood or affect. No depression, + anxiety.  No memory loss.   OBJ O2 2L General- Alert, Oriented, Affect-appropriate, Distress- none acute, +obese Skin- rash-none, lesions- none, excoriation- none Lymphadenopathy- none Head- atraumatic            Eyes- Gross vision intact, PERRLA, conjunctivae clear secretions            Ears- Hearing, canals-normal            Nose- +Crusting mucus, no-Septal dev, mucus, polyps, erosion, perforation             Throat- Mallampati III , mucosa clear , drainage- none, tonsils- atrophic, dentures Neck- flexible , trachea midline, no stridor , thyroid nl, carotid no bruit Chest - symmetrical excursion , unlabored           Heart/CV- RRR , no murmur , no gallop  , no rub, nl s1 s2                           - JVD- none , edema+2-3, stasis changes+, varices- none           Lung-  clear,  cough- none , dullness-none, rub- none           Chest wall-  Abd-  Br/ Gen/ Rectal- Not done, not indicated Extrem- cyanosis- none, clubbing, none, atrophy- none, strength-poor,       +rolling walker Neuro- +resting tremor R hand

## 2014-06-25 NOTE — Patient Instructions (Addendum)
Order- CXR   Dx COPD, lung nodule  Stop Dulera   We will replace it with Breo 100 Ellipta inhaler   1 puff then rinse mouth once daily  Sample and Script  We will look at helping with the assistance application for Spiriva. It is no onger on [employer] insurance, so if we can't get assistance, we will drop it.   Continue O2 for sleep and as needed 2L/ [facility] or [facility]?  Please call as needed

## 2014-06-30 MED ORDER — TIOTROPIUM BROMIDE MONOHYDRATE 18 MCG IN CAPS: 18.0000 ug | ORAL_CAPSULE | Freq: Every day | RESPIRATORY_TRACT | Status: DC

## 2014-06-30 MED ORDER — TIOTROPIUM BROMIDE MONOHYDRATE 18 MCG IN CAPS
18.0000 ug | ORAL_CAPSULE | Freq: Every day | RESPIRATORY_TRACT | Status: DC
Start: 2014-06-30 — End: 2014-06-30

## 2014-07-07 ENCOUNTER — Telehealth: Payer: Self-pay | Admitting: Internal Medicine

## 2014-07-07 NOTE — Telephone Encounter (Signed)
Called and spoke to Christine Mcconnell. Christine Mcconnell needed the verbal ok to process spiriva as there was not a physician's signature on document, verbal given. Christine Mcconnell stated she will move forward with processing the order and nothing further is needed.

## 2014-07-07 NOTE — Telephone Encounter (Signed)
Return call.Christine Mcconnell °

## 2014-07-07 NOTE — Telephone Encounter (Signed)
Patient Instructions     Order- CXR Dx COPD, lung nodule  Stop Dulera We will replace it with Breo 100 Ellipta inhaler 1 puff then rinse mouth once daily Sample and Script  We will look at helping with the assistance application for Spiriva. It is no onger on [employer] insurance, so if we can't get assistance, we will drop it.   Continue O2 for sleep and as needed 2L/ [facility]  Please call as needed   Looks as though pt assistance was mentioned at last OV but I do not see anything as to where that patient brought the paper work back for CY to fill his part out and fax in.  LMTCB

## 2014-07-19 NOTE — Assessment & Plan Note (Signed)
Clinically mild persistent Plan-chest x-ray, change Dulera to Breo 100, we will try to help get patient assistance for her Spiriva, chest x-ray

## 2014-07-19 NOTE — Assessment & Plan Note (Signed)
She did not get the CT chest we had ordered previously. Plan-chest x-ray

## 2014-07-19 NOTE — Assessment & Plan Note (Signed)
Failed CPAP. Wearing oxygen for sleep

## 2015-06-28 ENCOUNTER — Ambulatory Visit: Payer: Medicare HMO | Admitting: Internal Medicine

## 2015-06-28 ENCOUNTER — Ambulatory Visit: Payer: Medicare HMO | Admitting: Pulmonary Disease

## 2015-07-06 ENCOUNTER — Telehealth: Payer: Self-pay | Admitting: Internal Medicine

## 2015-07-06 DIAGNOSIS — G4733 Obstructive sleep apnea (adult) (pediatric): Secondary | ICD-10-CM

## 2015-07-06 NOTE — Telephone Encounter (Signed)
Pt uses [facility] for her CPAP and supplies. Pt is current with OV's with CY and order has been sent to [facility] for new supplies. Pt is aware. Nothing more needed at this time.

## 2015-08-09 ENCOUNTER — Ambulatory Visit: Payer: Medicare HMO | Admitting: Internal Medicine

## 2015-09-03 ENCOUNTER — Ambulatory Visit: Payer: Medicare HMO | Admitting: Internal Medicine

## 2015-09-22 ENCOUNTER — Telehealth: Payer: Self-pay | Admitting: Internal Medicine

## 2015-09-22 NOTE — Telephone Encounter (Signed)
Attempted to call pt 3 times and received busy tone. WCB

## 2015-09-23 MED ORDER — FLUTICASONE FUROATE-VILANTEROL 100-25 MCG/INH IN AEPB: INHALATION_SPRAY | RESPIRATORY_TRACT | Status: DC

## 2015-09-23 NOTE — Telephone Encounter (Signed)
Spoke with pt, requesting refill on breo.  Pt has not been seen since 06/2014, has no-showed or cancelled last 3 appts with CY.   Pt scheduled for next available with CY, enough breo sent in to last until this visit, emphasized importance of keeping appt for future refills.  Pt expressed understanding.  Nothing further needed.

## 2015-09-27 ENCOUNTER — Encounter: Payer: Self-pay | Admitting: Internal Medicine

## 2015-09-27 ENCOUNTER — Ambulatory Visit (INDEPENDENT_AMBULATORY_CARE_PROVIDER_SITE_OTHER): Payer: Medicare HMO | Admitting: Internal Medicine

## 2015-09-27 ENCOUNTER — Ambulatory Visit (INDEPENDENT_AMBULATORY_CARE_PROVIDER_SITE_OTHER)
Admission: RE | Admit: 2015-09-27 | Discharge: 2015-09-27 | Disposition: A | Discharge status: Home / Self Care | Payer: Medicare HMO | Source: Ambulatory Visit | Attending: Internal Medicine | Admitting: Internal Medicine

## 2015-09-27 VITALS — BP 124/70 | HR 61 | Ht 62.0 in | Wt 223.2 lb

## 2015-09-27 DIAGNOSIS — R918 Other nonspecific abnormal finding of lung field: Secondary | ICD-10-CM | POA: Diagnosis not present

## 2015-09-27 DIAGNOSIS — J449 Chronic obstructive pulmonary disease, unspecified: Secondary | ICD-10-CM | POA: Diagnosis not present

## 2015-09-27 DIAGNOSIS — Z87891 Personal history of nicotine dependence: Secondary | ICD-10-CM

## 2015-09-27 DIAGNOSIS — G4733 Obstructive sleep apnea (adult) (pediatric): Secondary | ICD-10-CM

## 2015-09-27 DIAGNOSIS — J9611 Chronic respiratory failure with hypoxia: Secondary | ICD-10-CM

## 2015-09-27 NOTE — Progress Notes (Signed)
07/14/11- 83 yoF former smoker referred courtesy of Primus Bravo, NP, [facility] because of wheezing dyspnea.  She quit smoking 25 years ago. She describes wheezing over the past several months, not prior. There is no history of asthma but she has had previous bronchitis and pneumonia. It helps to use her albuterol inhaler. She was put on albuterol 2 mg tablets used 3 times daily and well tolerated. He wheezes at night and randomly through the day with a little variation from one day to the next. She doesn't recognize the effect of location, whether or exposure. She does notice shortness of breath with exertion. She had had a recent chest x-ray not yet available to [place]. Admits heartburn, controlled by Prilosec. She refluxes solid foods and coughs drinking liquids. Medical history of coronary disease with 2 stents, hypertension but no infarction or heart failure. Surgery for bilateral knee replacement without history of DVT. Treated for sleep apnea. Diagnosed with a mild CVA in 1985. Has had flu and pneumococcal vaccines. Married to [place]- a patient here, retired from housekeeping at [employer]. 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  08/11/11- 47 yoF former smoker referred courtesy of Primus Bravo, NP, [facility] because of wheezing dyspnea.   Husband here. She still likes her albuterol tablets which she says causes no nervousness or palpitation. Saw a little added benefit from [place] trial. PFT- 08/11/2011 slight obstructive airways disease in small airways with response to bronchodilator. FEV1 1.41/86%, FEV1/FVC 0.81 TLC 88% DLCO 74%.  03/15/12- 55 yoF former smoker referred courtesy of Primus Bravo, NP, [facility] because of wheezing dyspnea/ Allergic Asthma, complicated by CAD/ stents, OSA/ CPAP. Husband here  Pt states breathing is "fair". Elwin Sleight was too expensive. Spiriva helps some. Still some wheeze. Proair rescue inhaler 2 or 3 times daily plus albuterol tablets 2 mg, 3 times  daily. Uses CPAP/ [facility], unknown pressure, based on sleep study done in [place].  06/25/12- 31 yoF former smoker referred courtesy of Primus Bravo, NP, [facility] because of wheezing dyspnea/ Allergic Asthma, complicated by CAD/ stents, OSA/ CPAP. Husband here Since last here, hospitalized at [facility] December 23 through 06/14/2012 for acute on chronic respiratory failure possible early pneumonia, 1.5 cm nodule. Discharge summary reviewed. She says she went because her legs were red and swollen. She dropped off of CPAP. Breathing is now better, can walk short distances in the home but is unsteady and dyspneic. She is unclear about her medications but has home nurse and physical therapist coming. O2 2L/ [facility] CXR 03/22/12- IMPRESSION:  No active disease. Markedly tortuous ectatic thoracic aorta.  Upper thoracic levoscoliosis and lower thoracic dextroscoliosis.  Original Report Authenticated By: Natasha Mead, M.D.  CTaChest 06/10/2012- Report available: Clustered nodules left lower lobe, likely infectious, isolated 1.5 cm nodule left lower lobe with followup CT recommended in 2-3 months, limited technically for evaluation of pulmonary embolism with no central emboli seen, coronary disease.  06/25/14- 57 yoF former smoker referred courtesy of Primus Bravo, NP, [facility] because of wheezing dyspnea/ Allergic Asthma, complicated by CAD/ stents, OSA/ CPAP. Husband here Did not keep appt for CT scheduled 06/25/12 FOLLOWS FOR: Pt reports breathing has been fair since last OV-- breathing feels restricted at night. Pt has new insurance and needs updated tests--brought paper from Insurance to review. O2 2-3 L for sleep and as needed/ [facility] She is not using CPAP. Occasionally uses her nebulizer. Denies productive cough  09/27/2015-82 year old female former smoker followed for asthma complicated by CAD/stent, OSA/CPAP CPAP/[facility] oxygen  2 L FOLLOWS FOR:  Pt states she does not wear her CPAP and uses O2 only as needed. Pt's husband states they have been callind machine service issues. DME to help get CPAP fixed-comes on and then goes off after few minutes of being on with face mask on patient.  Not using her CPAP. They are in separate rooms but her husband says she is not snoring. Frequent nocturia, several times each night made CPAP too much of a nuisance. She turned in her own oxygen and is using her husband's supply. CXR 06/25/2014 IMPRESSION: Old granulomatous disease with atelectasis versus scarring in LEFT mid lung. No acute abnormalities. Electronically Signed  By: Ulyses SouthwardMark Boles M.D.  On: 06/25/2014 16:59  ROS-see HPI Constitutional:   No-   weight loss, night sweats, fevers, chills, fatigue, lassitude. HEENT:   No-  headaches, difficulty swallowing, tooth/dental problems, sore throat,       No-  sneezing, itching, ear ache, nasal congestion, post nasal drip,  CV:  No-   chest pain, orthopnea, PND, +swelling in lower extremities, no-anasarca, dizziness, palpitations Resp: + shortness of breath with exertion or at rest.              No-   productive cough,  + non-productive cough,  No- coughing up of blood.              No-   change in color of mucus.  + wheezing.   Skin: No-   rash or lesions. GI:  + heartburn, indigestion,  No-abdominal pain, nausea, vomiting,            GU:  MS:  + joint pain or swelling.   Neuro-     nothing unusual Psych:  No- change in mood or affect. No depression, + anxiety.  No memory loss.  OBJ General- Alert, Oriented, Affect-appropriate, Distress- none acute, +obese Skin- rash-none, lesions- none, excoriation- none Lymphadenopathy- none Head- atraumatic            Eyes- Gross vision intact, PERRLA, conjunctivae clear secretions            Ears- Hearing, canals-normal            Nose- +Crusting mucus, no-Septal dev, mucus, polyps, erosion, perforation             Throat- Mallampati III , mucosa  clear , drainage- none, tonsils- atrophic, dentures Neck- flexible , trachea midline, no stridor , thyroid nl, carotid no bruit Chest - symmetrical excursion , unlabored           Heart/CV- RRR , no murmur , no gallop  , no rub, nl s1 s2                           - JVD- none , edema+2-3, stasis changes+, varices- none           Lung-  + few crackles right base,  cough- none , dullness-none, rub- none           Chest wall-  Abd-  Br/ Gen/ Rectal- Not done, not indicated Extrem- cyanosis- none, clubbing, none, atrophy- none, strength-poor,       +rolling walker Neuro- +resting tremor R hand

## 2015-09-27 NOTE — Patient Instructions (Addendum)
Order- schedule ONOX room air      Dx COPD, OSA  Order- CXR     Dx former smoker  Order- DME- [facility] [employer]- please service CPAP machine. Patient says it is cutting off. Continue AirView.

## 2015-10-03 DIAGNOSIS — J9611 Chronic respiratory failure with hypoxia: Secondary | ICD-10-CM | POA: Insufficient documentation

## 2015-10-03 NOTE — Assessment & Plan Note (Signed)
Plan-chest x-ray 

## 2015-10-03 NOTE — Assessment & Plan Note (Signed)
She is not compliant with CPAP. She complains that the mask is uncomfortable but admits on direct questioning that she probably wouldn't use it anyway. We discussed medical concerns about treating obstructive sleep apnea, treatment options. Plan-we will ask [facility]

## 2015-10-03 NOTE — Assessment & Plan Note (Signed)
Discussed indication for oxygen with sleep in addition to her CPAP Plan-overnight oximetry on room air

## 2016-01-17 ENCOUNTER — Telehealth: Payer: Self-pay | Admitting: Internal Medicine

## 2016-01-17 DIAGNOSIS — G4733 Obstructive sleep apnea (adult) (pediatric): Secondary | ICD-10-CM

## 2016-01-17 NOTE — Telephone Encounter (Signed)
We do not have a sleep study on file for patient, which we will need in order to switch DME companies.   atc [facility] to have this faxed, office is currently closed.  wcb tomorrow.

## 2016-01-17 NOTE — Telephone Encounter (Signed)
Ok to order DME change for continuation of current CPAP settings, mask of choice, supplies, humidifier, AirView for dx OSA

## 2016-01-17 NOTE — Telephone Encounter (Signed)
Spoke with pt, states that she wants to switch cpap care from [facility] to [facility].  States that [facility] is out of network with her insurance.   CY ok to switch to [facility] for cpap?  Thanks!

## 2016-01-18 NOTE — Telephone Encounter (Signed)
Attempted to call [facility] and they are not open yet.  Will try back later.

## 2016-01-20 ENCOUNTER — Telehealth: Payer: Self-pay | Admitting: *Deleted

## 2016-01-20 DIAGNOSIS — G4733 Obstructive sleep apnea (adult) (pediatric): Secondary | ICD-10-CM

## 2016-01-20 NOTE — Telephone Encounter (Signed)
Called and lm for [facility] to call back to see if we can get a copy of the sleep study faxed over.

## 2016-01-20 NOTE — Telephone Encounter (Signed)
Order has been placed for pt to change DME companies.  Nothing further is needed.

## 2016-01-20 NOTE — Telephone Encounter (Signed)
-----   Message from Henderson Newcomer sent at 01/20/2016  3:57 PM EDT ----- Katina Dung. This pt will be a new CPAP pt for [place] so we'll need the CPAP order on the template with the pressure settings as well. Can you help with that please? Thanks! Melissa ----- Message ----- From: Lucilla Edin Sent: 01/20/2016  10:28 AM To: Melissa Stenson  Please see order in computer.  Order for DME change from [facility] to [facility]---continuation of current CPAP settings, mask of choice, supplies, humidifier, AirView for DX of OSA  I have faxed the pt's Sleep Study to you @[facility]  that [facility] sent [place]  Thanks Dawne

## 2016-01-25 ENCOUNTER — Telehealth: Payer: Self-pay | Admitting: Internal Medicine

## 2016-01-25 NOTE — Telephone Encounter (Signed)
See if patient can come in tomorrow morning in the held spots. Thanks.

## 2016-01-25 NOTE — Telephone Encounter (Signed)
Called spoke with pt. She had to cancel her appt for 01/27/16. She is needing a sooner app than next available. She reports she has rehab people coming out on the day her appt was that she can't miss. Please advise Florentina AddisonKatie thanks

## 2016-01-25 NOTE — Telephone Encounter (Signed)
Called pt and appt Is scheduled for tomorrow.

## 2016-01-26 ENCOUNTER — Ambulatory Visit (INDEPENDENT_AMBULATORY_CARE_PROVIDER_SITE_OTHER): Payer: Medicare HMO | Admitting: Internal Medicine

## 2016-01-26 ENCOUNTER — Encounter: Payer: Self-pay | Admitting: Internal Medicine

## 2016-01-26 VITALS — BP 122/78 | HR 71 | Ht 62.0 in | Wt 221.8 lb

## 2016-01-26 DIAGNOSIS — G4733 Obstructive sleep apnea (adult) (pediatric): Secondary | ICD-10-CM

## 2016-01-26 DIAGNOSIS — J449 Chronic obstructive pulmonary disease, unspecified: Secondary | ICD-10-CM | POA: Diagnosis not present

## 2016-01-26 DIAGNOSIS — J9611 Chronic respiratory failure with hypoxia: Secondary | ICD-10-CM

## 2016-01-26 DIAGNOSIS — J4520 Mild intermittent asthma, uncomplicated: Secondary | ICD-10-CM

## 2016-01-26 MED ORDER — FLUTICASONE FUROATE-VILANTEROL 100-25 MCG/INH IN AEPB: INHALATION_SPRAY | RESPIRATORY_TRACT | 12 refills | Status: AC

## 2016-01-26 MED ORDER — ALBUTEROL SULFATE HFA 108 (90 BASE) MCG/ACT IN AERS: 2.0000 | INHALATION_SPRAY | Freq: Four times a day (QID) | RESPIRATORY_TRACT | 99 refills | Status: AC | PRN

## 2016-01-26 NOTE — Assessment & Plan Note (Signed)
Some nonspecific cough at night but no cough or wheeze during the day to suggest active bronchospasm. Restriction of exhaled volume on today's spirometry may reflect her obesity and component of air trapping. Plan-overnight oximetry, sleep study

## 2016-01-26 NOTE — Assessment & Plan Note (Signed)
She finds Breo helpful without acute events. Not using rescue inhaler. Plan-refills.

## 2016-01-26 NOTE — Patient Instructions (Addendum)
Order- schedule unattended home sleep test     Dx OSA  Order- overnight oximetry on room air     Dx COPD mixed type  Order- office spirometry    Dx COPD mixed type  Refill scripts sent for your Proair rescue inhaler and Breo maintenance inhaler  My nurse will work with you to schedule you back after your ome sleep test

## 2016-01-26 NOTE — Assessment & Plan Note (Signed)
She recognizes she is waking short of breath at night at times and agrees to reassessment. Plan-unattended home sleep test on room air

## 2016-01-26 NOTE — Progress Notes (Signed)
HPI  F former smoker referred courtesy of Primus Bravo, NP, [facility] because of wheezing dyspnea.  She quit smoking 25 years ago.   06/25/12- 89 yoF former smoker referred courtesy of Primus Bravo, NP, [facility] because of wheezing dyspnea/ Allergic Asthma, complicated by CAD/ stents, OSA/ CPAP. Husband here Since last here, hospitalized at [facility] December 23 through 06/14/2012 for acute on chronic respiratory failure possible early pneumonia, 1.5 cm nodule. Discharge summary reviewed. She says she went because her legs were red and swollen. She dropped off of CPAP. Breathing is now better, can walk short distances in the home but is unsteady and dyspneic. She is unclear about her medications but has home nurse and physical therapist coming. O2 2L/ [facility] CXR 03/22/12- IMPRESSION:  No active disease. Markedly tortuous ectatic thoracic aorta.  Upper thoracic levoscoliosis and lower thoracic dextroscoliosis.  Original Report Authenticated By: Natasha Mead, M.D.  CTaChest 06/10/2012- Report available: Clustered nodules left lower lobe, likely infectious, isolated 1.5 cm nodule left lower lobe with followup CT recommended in 2-3 months, limited technically for evaluation of pulmonary embolism with no central emboli seen, coronary disease.  06/25/14- 51 yoF former smoker referred courtesy of Primus Bravo, NP, [facility] because of wheezing dyspnea/ Allergic Asthma, complicated by CAD/ stents, OSA/ CPAP. Husband here Did not keep appt for CT scheduled 06/25/12 FOLLOWS FOR: Pt reports breathing has been fair since last OV-- breathing feels restricted at night. Pt has new insurance and needs updated tests--brought paper from Insurance to review. O2 2-3 L for sleep and as needed/ [facility] She is not using CPAP. Occasionally uses her nebulizer. Denies productive cough  09/27/2015-82 year old female former smoker followed for asthma complicated by  CAD/stent, OSA/CPAP CPAP/[facility] oxygen 2 L FOLLOWS FOR: Pt states she does not wear her CPAP and uses O2 only as needed. Pt's husband states they have been callind machine service issues. DME to help get CPAP fixed-comes on and then goes off after few minutes of being on with face mask on patient.  Not using her CPAP. They are in separate rooms but her husband says she is not snoring. Frequent nocturia, several times each night made CPAP too much of a nuisance. She turned in her own oxygen and is using her husband's supply. CXR 06/25/2014 IMPRESSION: Old granulomatous disease with atelectasis versus scarring in LEFT mid lung. No acute abnormalities. Electronically Signed  By: Ulyses Southward M.D.  On: 06/25/2014 16:59  01/26/2016-82 year old female former smoker followed for asthma, OSA/failed CPAP, history of small lung nodule/probable atypical infection,, complicated by CAD/stent CPAP/[facility] oxygen 2 L FOLLOWS FOR: DME [facility]. Pt will start back on CPAP now-pt is understanding of how important it is to use and the fact of Insurance guidelines. Pt denies any wheezing, SOB, cough, or congestion.  She occasionally feels she needs to use her husband's oxygen in the middle of the night. Says her primary physician is restarting her own oxygen. Still has a CPAP machine which she has not used in a couple of years. Husband tells her she coughs at night but otherwise very little cough or wheeze, no chest pain or palpitation. Wheelchair because of dyspnea and back pain. Office Spirometry 01/26/2016-mild restriction of exhaled volume. FVC 1.61/71%, FEV1 1.37/81%, FEV1/FVC 0.85 CXR 09/27/2015 IMPRESSION: 1. Hyperexpanded lungs without acute cardiopulmonary disease. 2. Sequela of prior granulomatous infection as above. 3. Grossly unchanged tortuous and likely at least ectatic thoracic aorta, similar to the 02/2012 examination. Electronically Signed   By: Jonny Ruiz  Watts M.D.   On: 09/27/2015  10:10  ROS-see HPI Constitutional:   No-   weight loss, night sweats, fevers, chills, fatigue, lassitude. HEENT:   No-  headaches, difficulty swallowing, tooth/dental problems, sore throat,       No-  sneezing, itching, ear ache, nasal congestion, post nasal drip,  CV:  No-   chest pain, orthopnea, PND, +swelling in lower extremities, no-anasarca, dizziness, palpitations Resp: + shortness of breath with exertion or at rest.              No-   productive cough,  + non-productive cough,  No- coughing up of blood.              No-   change in color of mucus.  + wheezing.   Skin: No-   rash or lesions. GI:  + heartburn, indigestion,  No-abdominal pain, nausea, vomiting,            GU:  MS:  + joint pain or swelling.   Neuro-     nothing unusual Psych:  No- change in mood or affect. No depression, + anxiety.  No memory loss.  OBJ General- Alert, Oriented, Affect-appropriate, Distress- none acute, +obese, +90% on room air Skin- rash-none, lesions- none, excoriation- none Lymphadenopathy- none Head- atraumatic            Eyes- Gross vision intact, PERRLA, conjunctivae clear secretions            Ears- Hearing, canals-normal            Nose- clear, no-Septal dev, mucus, polyps, erosion, perforation             Throat- Mallampati III , mucosa clear , drainage- none, tonsils- atrophic, dentures Neck- flexible , trachea midline, no stridor , thyroid nl, carotid no bruit Chest - symmetrical excursion , unlabored           Heart/CV- RRR , no murmur , no gallop  , no rub, nl s1 s2                           - JVD- none , edema+2-3, stasis changes+, varices- none           Lung-  clear,  cough- none , dullness-none, rub- none           Chest wall-  Abd-  Br/ Gen/ Rectal- Not done, not indicated Extrem- cyanosis- none, clubbing, none, atrophy- none, strength-poor,       + Wheelchair Neuro- +resting tremor R hand

## 2016-01-27 ENCOUNTER — Ambulatory Visit: Payer: Medicare HMO | Admitting: Internal Medicine

## 2016-01-31 ENCOUNTER — Telehealth: Payer: Self-pay | Admitting: Internal Medicine

## 2016-01-31 NOTE — Telephone Encounter (Signed)
lmtcb x1 

## 2016-02-01 NOTE — Telephone Encounter (Signed)
lmomtcb x 2 for rodney with [facility].

## 2016-02-02 NOTE — Telephone Encounter (Signed)
lmtcb X3 for Thereasa DistanceRodney with [facility].  Will close message per triage protocol.

## 2016-02-14 ENCOUNTER — Telehealth: Payer: Self-pay | Admitting: Internal Medicine

## 2016-02-14 NOTE — Telephone Encounter (Signed)
error 

## 2016-03-15 ENCOUNTER — Ambulatory Visit: Payer: Medicare HMO | Admitting: Internal Medicine

## 2016-03-28 ENCOUNTER — Encounter: Payer: Self-pay | Admitting: Internal Medicine

## 2016-12-15 ENCOUNTER — Telehealth: Payer: Self-pay | Admitting: Internal Medicine

## 2016-12-15 MED ORDER — FLUTICASONE FUROATE-VILANTEROL 100-25 MCG/INH IN AEPB: 1.0000 | INHALATION_SPRAY | Freq: Every day | RESPIRATORY_TRACT | 0 refills | Status: AC

## 2016-12-15 NOTE — Telephone Encounter (Signed)
Called and spoke with pt and she is aware of samples that have been left up front for her.

## 2017-01-04 IMAGING — CR DG CHEST 2V
2 series · 2 of 2 positions shown · non-contrast
Comparison: 03/15/2012

CLINICAL DATA: Pulmonary nodules, followup, hypertension,
hyperlipidemia, hiatal hernia

EXAM:
CHEST  2 VIEW

[view not recorded (1 of 2)]
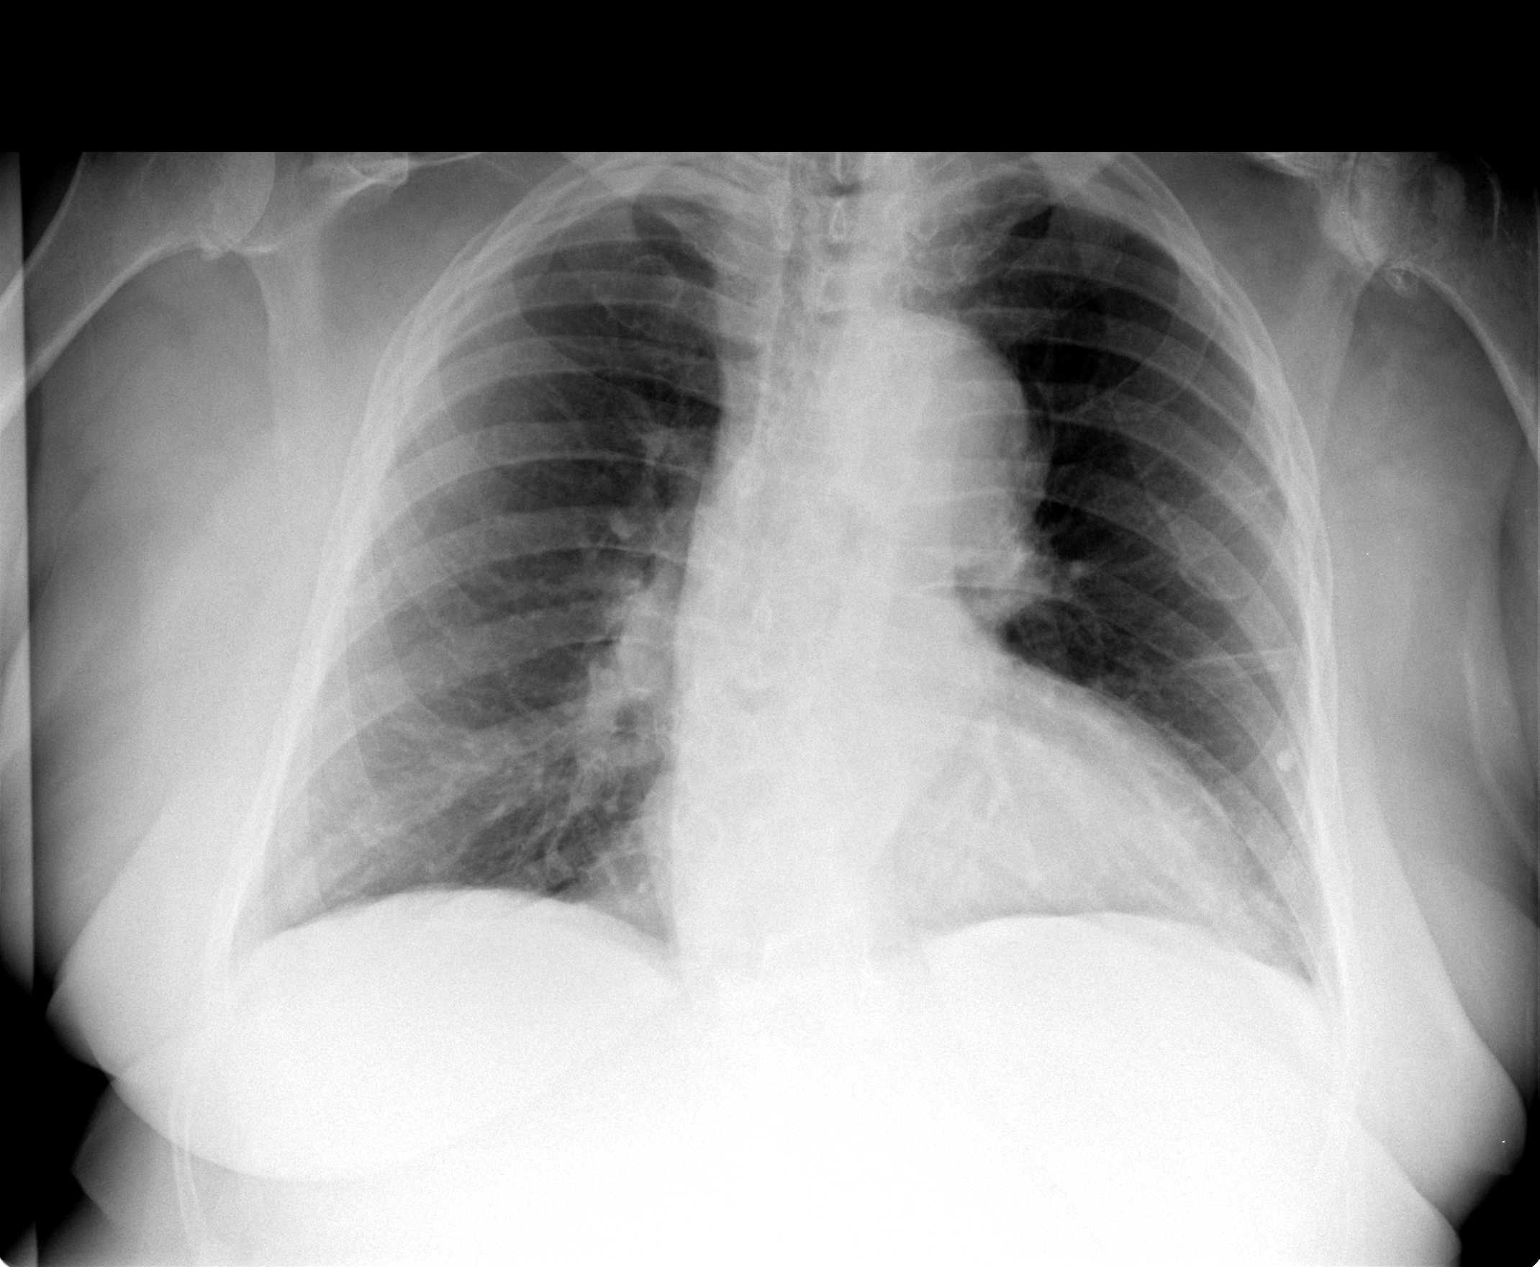

[view not recorded (2 of 2)]
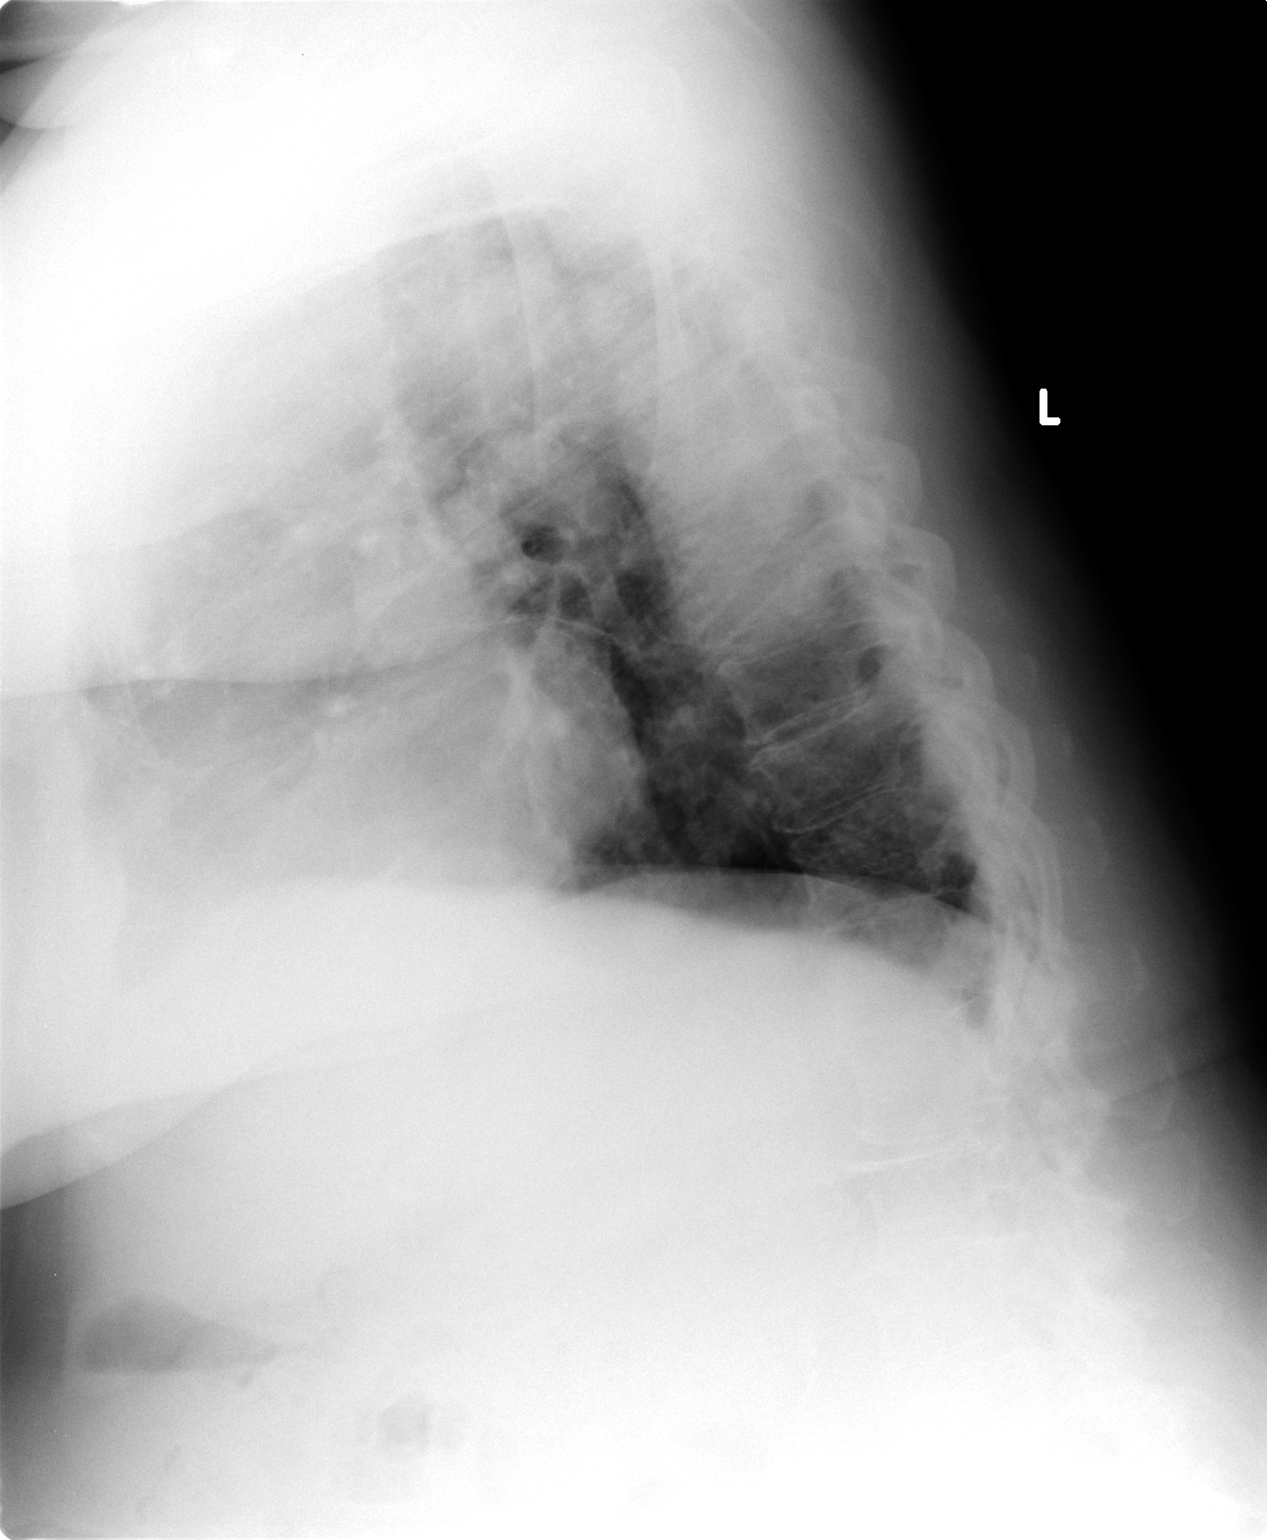

[2 of 2 positions shown; findings below may reference images not displayed]

FINDINGS: Upper normal heart size.

Tortuous aorta.

Mediastinal contours and pulmonary vascularity normal.

Calcified granuloma lower LEFT chest with scattered areas of
atelectasis versus scarring.

No acute infiltrate, pleural effusion or pneumothorax.

Advanced LEFT and minimal RIGHT glenohumeral degenerative changes.
IMPRESSION: Old granulomatous disease with atelectasis versus scarring in LEFT
mid lung.

No acute abnormalities.

## 2017-03-28 NOTE — Telephone Encounter (Signed)
Pt never came to pick up samples.  Samples have been removed from front cubby and placed back on shelf.  Nothing further needed.

## 2018-04-08 IMAGING — DX DG CHEST 2V
2 series · 2 of 2 positions shown · non-contrast
Comparison: 07/05/2014; 03/15/2012

CLINICAL DATA: Routine checkup. Former smoker. History of
hypertension.

EXAM:
CHEST  2 VIEW

[chest pa]
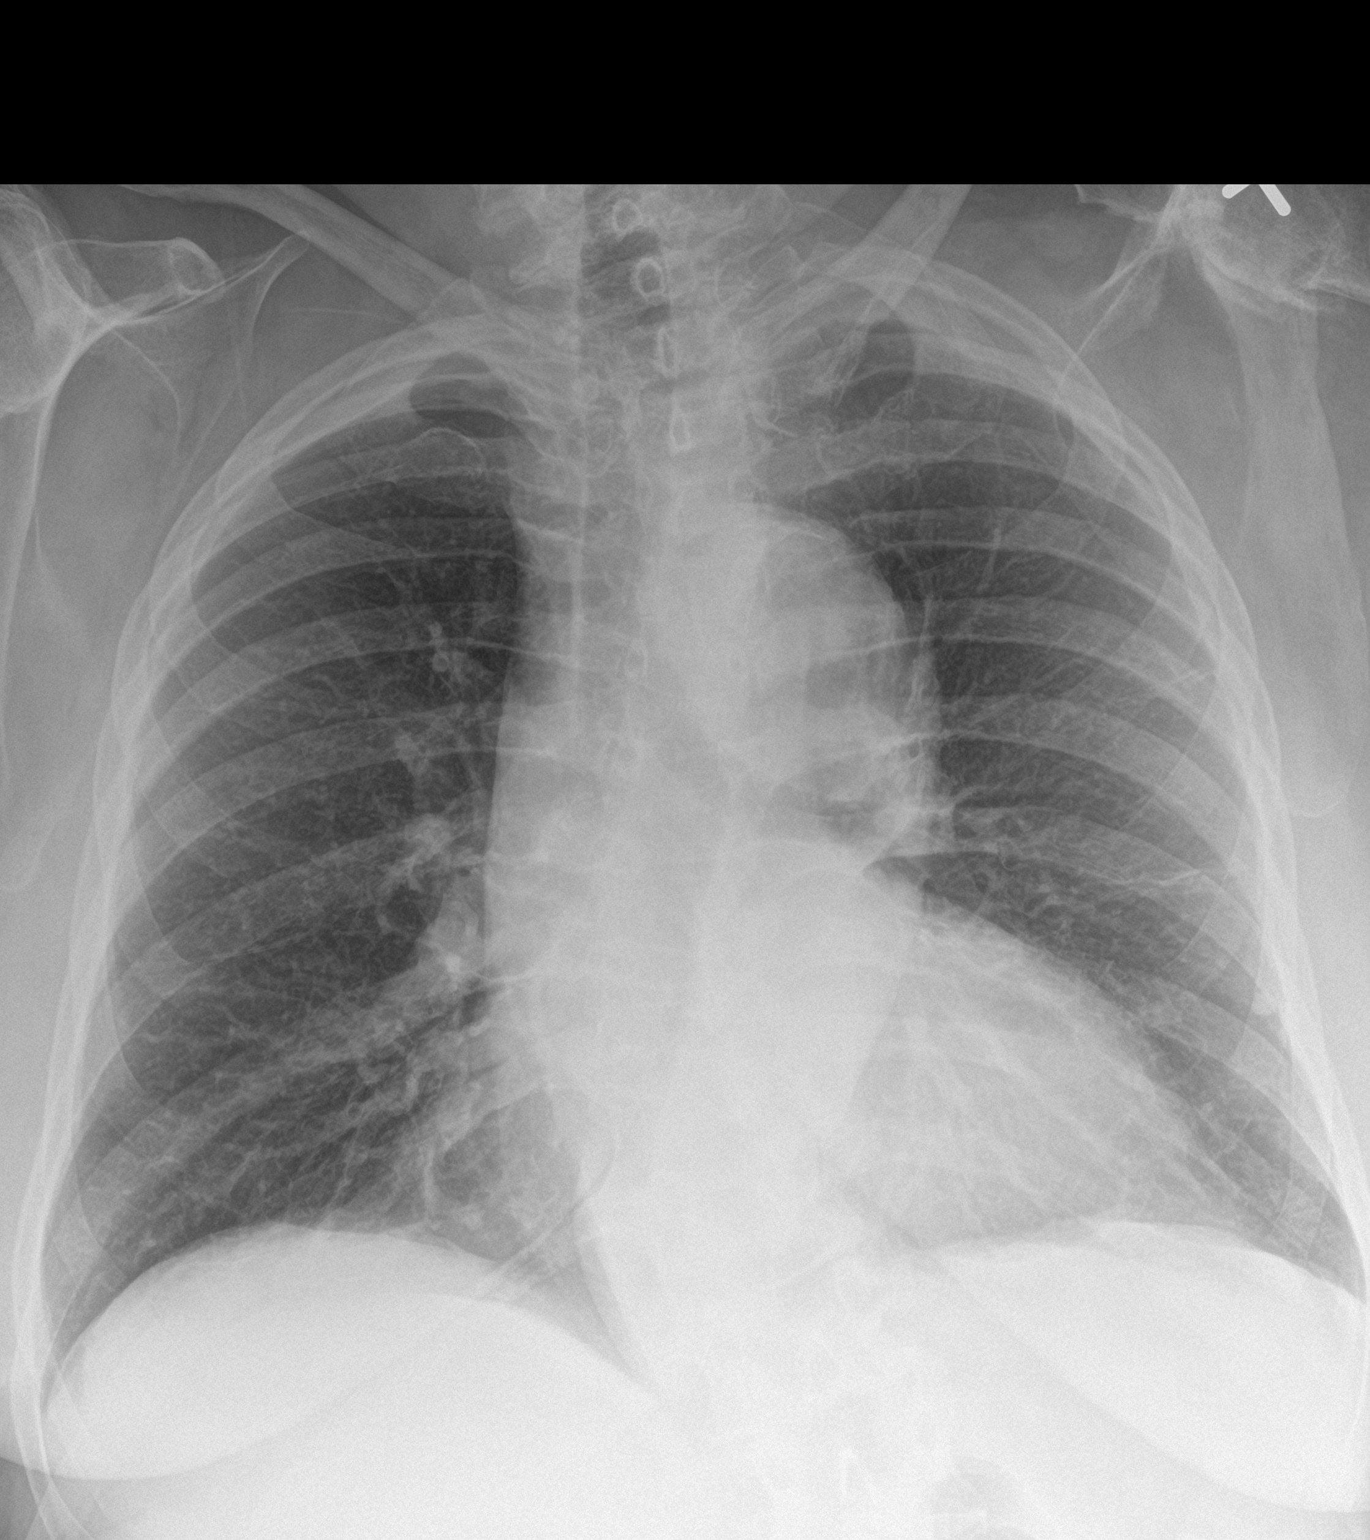

[chest lat]
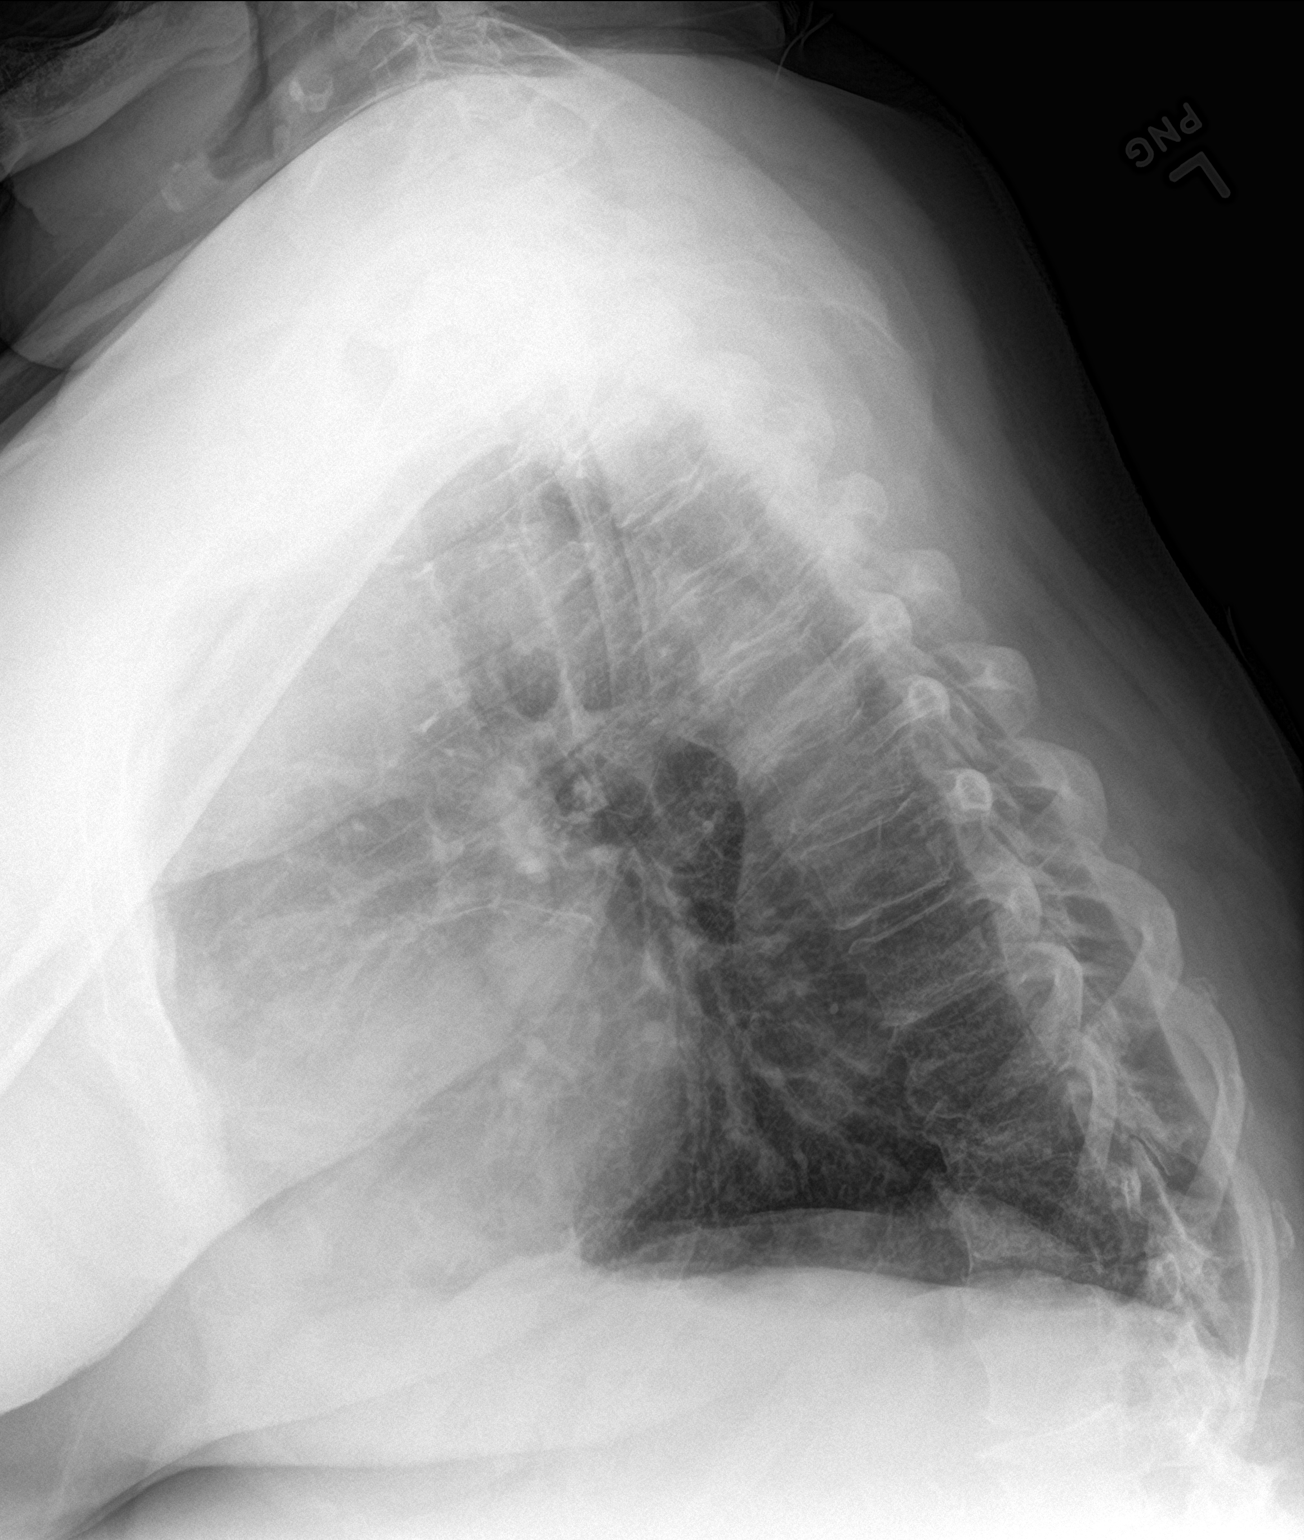

[2 of 2 positions shown; findings below may reference images not displayed]

FINDINGS: Grossly unchanged enlarged cardiac silhouette and mediastinal
contours with tortuosity and expected at least ectasia of the
thoracic aorta. Atherosclerotic plaque within the thoracic aorta.
The lungs appear hyperexpanded. Minimal linear heterogeneous
opacities within the peripheral aspect of the left mid lung are
unchanged in favored to represent atelectasis or scar. Unchanged
punctate (approximately 5 mm) granuloma within the peripheral aspect
the left lower lung, similar to the [DATE] examination. No new
focal airspace opacities. No pleural effusion or pneumothorax. No
evidence of edema. Re- demonstrated moderate scoliotic curvature of
the thoracolumbar spine and advanced degenerative change of the left
glenohumeral joint, incompletely evaluated
IMPRESSION: 1. Hyperexpanded lungs without acute cardiopulmonary disease.
2. Sequela of prior granulomatous infection as above.
3. Grossly unchanged tortuous and likely at least ectatic thoracic
aorta, similar to the [DATE] examination.
